# Patient Record
Sex: Female | Born: 1975 | Hispanic: No | Marital: Married | Smoking: Former smoker
Health system: Southern US, Community
[De-identification: ages and names within clinical notes are randomized; demographics above are authoritative.]

## PROBLEM LIST (undated history)

## (undated) DIAGNOSIS — Z789 Other specified health status: Secondary | ICD-10-CM

## (undated) HISTORY — PX: NO PAST SURGERIES: SHX2092

## (undated) HISTORY — PX: MOUTH SURGERY: SHX715

---

## 1999-01-08 ENCOUNTER — Emergency Department (HOSPITAL_COMMUNITY): Admission: EM | Admit: 1999-01-08 | Discharge: 1999-01-08 | Payer: Self-pay | Admitting: Emergency Medicine

## 1999-01-08 ENCOUNTER — Encounter: Payer: Self-pay | Admitting: Emergency Medicine

## 1999-04-10 ENCOUNTER — Other Ambulatory Visit: Admission: RE | Admit: 1999-04-10 | Discharge: 1999-04-10 | Payer: Self-pay | Admitting: Obstetrics and Gynecology

## 2000-04-11 ENCOUNTER — Other Ambulatory Visit: Admission: RE | Admit: 2000-04-11 | Discharge: 2000-04-11 | Payer: Self-pay | Admitting: *Deleted

## 2000-09-08 ENCOUNTER — Other Ambulatory Visit: Admission: RE | Admit: 2000-09-08 | Discharge: 2000-09-08 | Payer: Self-pay | Admitting: *Deleted

## 2001-04-01 ENCOUNTER — Inpatient Hospital Stay (HOSPITAL_COMMUNITY): Admission: AD | Admit: 2001-04-01 | Discharge: 2001-04-03 | Payer: Self-pay | Admitting: Obstetrics and Gynecology

## 2001-04-06 ENCOUNTER — Encounter: Admission: RE | Admit: 2001-04-06 | Discharge: 2001-05-06 | Payer: Self-pay | Admitting: Obstetrics and Gynecology

## 2001-12-11 ENCOUNTER — Other Ambulatory Visit: Admission: RE | Admit: 2001-12-11 | Discharge: 2001-12-11 | Payer: Self-pay | Admitting: *Deleted

## 2003-10-11 ENCOUNTER — Other Ambulatory Visit: Admission: RE | Admit: 2003-10-11 | Discharge: 2003-10-11 | Payer: Self-pay | Admitting: *Deleted

## 2003-10-18 ENCOUNTER — Emergency Department (HOSPITAL_COMMUNITY): Admission: AD | Admit: 2003-10-18 | Discharge: 2003-10-18 | Payer: Self-pay | Admitting: Family Medicine

## 2005-08-01 ENCOUNTER — Ambulatory Visit: Payer: Self-pay | Admitting: Orthopedic Surgery

## 2007-01-22 ENCOUNTER — Encounter: Admission: RE | Admit: 2007-01-22 | Discharge: 2007-01-22 | Payer: Self-pay | Admitting: Family Medicine

## 2010-11-18 ENCOUNTER — Encounter: Payer: Self-pay | Admitting: Obstetrics and Gynecology

## 2011-03-22 ENCOUNTER — Emergency Department (HOSPITAL_COMMUNITY): Payer: No Typology Code available for payment source

## 2011-03-22 ENCOUNTER — Emergency Department (HOSPITAL_COMMUNITY)
Admission: EM | Admit: 2011-03-22 | Discharge: 2011-03-23 | Disposition: A | Discharge status: Home / Self Care | Payer: No Typology Code available for payment source | Attending: Emergency Medicine | Admitting: Emergency Medicine

## 2011-03-22 DIAGNOSIS — S335XXA Sprain of ligaments of lumbar spine, initial encounter: Secondary | ICD-10-CM | POA: Insufficient documentation

## 2011-03-22 DIAGNOSIS — M25559 Pain in unspecified hip: Secondary | ICD-10-CM | POA: Insufficient documentation

## 2011-03-22 DIAGNOSIS — Y92410 Unspecified street and highway as the place of occurrence of the external cause: Secondary | ICD-10-CM | POA: Insufficient documentation
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
## 2018-06-19 LAB — OB RESULTS CONSOLE ABO/RH: RH Type: POSITIVE

## 2018-06-19 LAB — OB RESULTS CONSOLE RUBELLA ANTIBODY, IGM: Rubella: IMMUNE

## 2018-06-19 LAB — OB RESULTS CONSOLE ANTIBODY SCREEN: Antibody Screen: NEGATIVE

## 2018-06-19 LAB — OB RESULTS CONSOLE HIV ANTIBODY (ROUTINE TESTING): HIV: NONREACTIVE

## 2018-06-19 LAB — OB RESULTS CONSOLE GC/CHLAMYDIA
Chlamydia: NEGATIVE
Gonorrhea: NEGATIVE

## 2018-06-19 LAB — OB RESULTS CONSOLE RPR: RPR: NONREACTIVE

## 2018-06-19 LAB — OB RESULTS CONSOLE HEPATITIS B SURFACE ANTIGEN: Hepatitis B Surface Ag: NEGATIVE

## 2018-10-28 NOTE — L&D Delivery Note (Signed)
Delivery Note At 8:43 PM a viable female was delivered via Vaginal, Spontaneous (Presentation: DOA;  ).  APGAR: 6, 9; weight pending .   Placenta status: spontaneous, intact, .  Cord: 3VC  with the following complications: none.  Cord pH: n/a  Anesthesia:epidural   Episiotomy: None Lacerations: 1st degree;Perineal Suture Repair: 3.0 vicryl rapide 2 figure of 8's Est. Blood Loss (mL): 100  Mom to postpartum.  Baby to Couplet care / Skin to Skin.  Lendon Colonel 01/11/2019, 9:03 PM

## 2018-12-15 LAB — OB RESULTS CONSOLE GBS: GBS: NEGATIVE

## 2019-01-04 ENCOUNTER — Telehealth (HOSPITAL_COMMUNITY): Payer: Self-pay | Admitting: *Deleted

## 2019-01-04 ENCOUNTER — Encounter (HOSPITAL_COMMUNITY): Payer: Self-pay | Admitting: *Deleted

## 2019-01-05 NOTE — Telephone Encounter (Signed)
Preadmission screen  

## 2019-01-06 ENCOUNTER — Inpatient Hospital Stay (HOSPITAL_COMMUNITY)
Admission: AD | Admit: 2019-01-06 | Discharge: 2019-01-06 | Disposition: A | Discharge status: Home / Self Care | Payer: BLUE CROSS/BLUE SHIELD | Attending: Obstetrics and Gynecology | Admitting: Obstetrics and Gynecology

## 2019-01-06 ENCOUNTER — Encounter (HOSPITAL_COMMUNITY): Payer: Self-pay | Admitting: *Deleted

## 2019-01-06 ENCOUNTER — Other Ambulatory Visit: Payer: Self-pay

## 2019-01-06 DIAGNOSIS — Z3A38 38 weeks gestation of pregnancy: Secondary | ICD-10-CM

## 2019-01-06 DIAGNOSIS — Z0371 Encounter for suspected problem with amniotic cavity and membrane ruled out: Secondary | ICD-10-CM

## 2019-01-06 HISTORY — DX: Other specified health status: Z78.9

## 2019-01-06 LAB — AMNISURE RUPTURE OF MEMBRANE (ROM) NOT AT ARMC: Amnisure ROM: NEGATIVE

## 2019-01-06 NOTE — MAU Note (Signed)
PT SAYS SROM AT 0230-  STILL WET.   PNC  WITH DR Ernestina Penna-  VE IN OFFICE ON Monday- DENIES HSV AND MRSA. , GBS- NEG 5 West Progression Recent Vital Signs   There were no vitals taken for this visit.   No past medical history on file.   Expected Discharge Date     Diet Order    None       VTE Documentation      Work Intensity Score/Level of Care     @LEVELOFCARE @   Mobility        Significant Events    DC Barriers   Abnormal Labs:  Dionne Milo 01/06/2019, 6:58 AM CM.

## 2019-01-06 NOTE — Discharge Instructions (Signed)
Braxton Hicks Contractions °Contractions of the uterus can occur throughout pregnancy, but they are not always a sign that you are in labor. You may have practice contractions called Braxton Hicks contractions. These false labor contractions are sometimes confused with true labor. °What are Braxton Hicks contractions? °Braxton Hicks contractions are tightening movements that occur in the muscles of the uterus before labor. Unlike true labor contractions, these contractions do not result in opening (dilation) and thinning of the cervix. Toward the end of pregnancy (32-34 weeks), Braxton Hicks contractions can happen more often and may become stronger. These contractions are sometimes difficult to tell apart from true labor because they can be very uncomfortable. You should not feel embarrassed if you go to the hospital with false labor. °Sometimes, the only way to tell if you are in true labor is for your health care provider to look for changes in the cervix. The health care provider will do a physical exam and may monitor your contractions. If you are not in true labor, the exam should show that your cervix is not dilating and your water has not broken. °If there are no other health problems associated with your pregnancy, it is completely safe for you to be sent home with false labor. You may continue to have Braxton Hicks contractions until you go into true labor. °How to tell the difference between true labor and false labor °True labor °· Contractions last 30-70 seconds. °· Contractions become very regular. °· Discomfort is usually felt in the top of the uterus, and it spreads to the lower abdomen and low back. °· Contractions do not go away with walking. °· Contractions usually become more intense and increase in frequency. °· The cervix dilates and gets thinner. °False labor °· Contractions are usually shorter and not as strong as true labor contractions. °· Contractions are usually irregular. °· Contractions  are often felt in the front of the lower abdomen and in the groin. °· Contractions may go away when you walk around or change positions while lying down. °· Contractions get weaker and are shorter-lasting as time goes on. °· The cervix usually does not dilate or become thin. °Follow these instructions at home: ° °· Take over-the-counter and prescription medicines only as told by your health care provider. °· Keep up with your usual exercises and follow other instructions from your health care provider. °· Eat and drink lightly if you think you are going into labor. °· If Braxton Hicks contractions are making you uncomfortable: °? Change your position from lying down or resting to walking, or change from walking to resting. °? Sit and rest in a tub of warm water. °? Drink enough fluid to keep your urine pale yellow. Dehydration may cause these contractions. °? Do slow and deep breathing several times an hour. °· Keep all follow-up prenatal visits as told by your health care provider. This is important. °Contact a health care provider if: °· You have a fever. °· You have continuous pain in your abdomen. °Get help right away if: °· Your contractions become stronger, more regular, and closer together. °· You have fluid leaking or gushing from your vagina. °· You have bleeding from your vagina. °· You have low back pain that you never had before. °· You feel your baby’s head pushing down and causing pelvic pressure. °· Your baby is not moving inside you as much as it used to. °Summary °· Contractions that occur before labor are called Braxton Hicks contractions, false labor, or   practice contractions. °· Braxton Hicks contractions are usually shorter, weaker, farther apart, and less regular than true labor contractions. True labor contractions usually become progressively stronger and regular, and they become more frequent. °· Manage discomfort from Braxton Hicks contractions by changing position, resting in a warm bath,  drinking plenty of water, or practicing deep breathing. °This information is not intended to replace advice given to you by your health care provider. Make sure you discuss any questions you have with your health care provider. °Document Released: 02/27/2017 Document Revised: 07/29/2017 Document Reviewed: 02/27/2017 °Elsevier Interactive Patient Education © 2019 [employer] ° °

## 2019-01-06 NOTE — MAU Provider Note (Signed)
S: Martha Hester is a 43 y.o. G3P0010 at [redacted]w[redacted]d  who presents to MAU today complaining of leaking of fluid since 0230. She denies vaginal bleeding. She endorses contractions. She reports normal fetal movement.    O: BP 109/77 (BP Location: Right Arm)   Pulse 74   Temp 98.1 F (36.7 C) (Oral)   Resp 20   Ht 5\' 4"  (1.626 m)   Wt 75.3 kg   BMI 28.50 kg/m  GENERAL: Well-developed, well-nourished female in no acute distress.  HEAD: Normocephalic, atraumatic.  CHEST: Normal effort of breathing, regular heart rate ABDOMEN: Soft, nontender, gravid   Cervical exam:  Dilation: 2 Effacement (%): 30 Cervical Position: Middle Station: -3 Presentation: Vertex Exam by:: Dorrene German RN   Fetal Monitoring: Baseline: 125 Variability: moderate Accelerations: present Decelerations: variable Contractions: regular every 1-4 mins  Results for orders placed or performed during the hospital encounter of 01/06/19 (from the past 24 hour(s))  Amnisure rupture of membrane (rom)not at [facility]     Status: None   Collection Time: 01/06/19  7:32 AM  Result Value Ref Range   Amnisure ROM NEGATIVE     A: SIUP at [redacted]w[redacted]d  Membranes intact  P: No leakage of amniotic fluid into vagina - Reassurance given that membranes are not ruptured - Information provided on labor precautions, [facility] - Keep scheduled appt for IOL on 01/11/2019 - Patient verbalized an understanding of the plan of care and agrees.    Raelyn Mora, CNM 01/06/2019, 8:42 AM

## 2019-01-08 ENCOUNTER — Encounter (HOSPITAL_COMMUNITY): Payer: Self-pay | Admitting: *Deleted

## 2019-01-08 ENCOUNTER — Telehealth (HOSPITAL_COMMUNITY): Payer: Self-pay | Admitting: *Deleted

## 2019-01-08 ENCOUNTER — Other Ambulatory Visit (HOSPITAL_COMMUNITY): Payer: Self-pay | Admitting: *Deleted

## 2019-01-08 ENCOUNTER — Other Ambulatory Visit: Payer: Self-pay | Admitting: Obstetrics

## 2019-01-08 NOTE — Telephone Encounter (Signed)
Preadmission screen  

## 2019-01-10 ENCOUNTER — Other Ambulatory Visit: Payer: Self-pay | Admitting: Obstetrics

## 2019-01-11 ENCOUNTER — Inpatient Hospital Stay (HOSPITAL_COMMUNITY)
Admission: AD | Admit: 2019-01-11 | Discharge: 2019-01-13 | DRG: 807 | Disposition: A | Discharge status: Home / Self Care | Payer: 59 | Source: Ambulatory Visit | Attending: Obstetrics | Admitting: Obstetrics

## 2019-01-11 ENCOUNTER — Other Ambulatory Visit: Payer: Self-pay

## 2019-01-11 ENCOUNTER — Inpatient Hospital Stay (HOSPITAL_COMMUNITY): Payer: 59

## 2019-01-11 ENCOUNTER — Inpatient Hospital Stay (HOSPITAL_COMMUNITY): Payer: 59 | Admitting: Anesthesiology

## 2019-01-11 ENCOUNTER — Encounter (HOSPITAL_COMMUNITY): Payer: Self-pay

## 2019-01-11 DIAGNOSIS — Z87891 Personal history of nicotine dependence: Secondary | ICD-10-CM

## 2019-01-11 DIAGNOSIS — O9902 Anemia complicating childbirth: Secondary | ICD-10-CM | POA: Diagnosis present

## 2019-01-11 DIAGNOSIS — O26893 Other specified pregnancy related conditions, third trimester: Principal | ICD-10-CM | POA: Diagnosis present

## 2019-01-11 DIAGNOSIS — D509 Iron deficiency anemia, unspecified: Secondary | ICD-10-CM | POA: Diagnosis present

## 2019-01-11 DIAGNOSIS — Z3A39 39 weeks gestation of pregnancy: Secondary | ICD-10-CM

## 2019-01-11 DIAGNOSIS — Z0371 Encounter for suspected problem with amniotic cavity and membrane ruled out: Secondary | ICD-10-CM

## 2019-01-11 DIAGNOSIS — Z3490 Encounter for supervision of normal pregnancy, unspecified, unspecified trimester: Secondary | ICD-10-CM

## 2019-01-11 LAB — POCT FERN TEST: POCT Fern Test: POSITIVE

## 2019-01-11 LAB — CBC
HCT: 33.6 % — ABNORMAL LOW (ref 36.0–46.0)
Hemoglobin: 10.1 g/dL — ABNORMAL LOW (ref 12.0–15.0)
MCH: 24.9 pg — ABNORMAL LOW (ref 26.0–34.0)
MCHC: 30.1 g/dL (ref 30.0–36.0)
MCV: 82.8 fL (ref 80.0–100.0)
PLATELETS: 342 10*3/uL (ref 150–400)
RBC: 4.06 MIL/uL (ref 3.87–5.11)
RDW: 14.9 % (ref 11.5–15.5)
WBC: 11.2 10*3/uL — ABNORMAL HIGH (ref 4.0–10.5)
nRBC: 0.2 % (ref 0.0–0.2)

## 2019-01-11 LAB — TYPE AND SCREEN
ABO/RH(D): A POS
Antibody Screen: NEGATIVE

## 2019-01-11 LAB — RPR: RPR Ser Ql: NONREACTIVE

## 2019-01-11 MED ORDER — SENNOSIDES-DOCUSATE SODIUM 8.6-50 MG PO TABS
2.0000 | ORAL_TABLET | ORAL | Status: DC
  Filled 2019-01-11 (×2): qty 2

## 2019-01-11 MED ORDER — ACETAMINOPHEN 325 MG PO TABS: 650.0000 mg | ORAL_TABLET | ORAL | Status: DC | PRN

## 2019-01-11 MED ORDER — TERBUTALINE SULFATE 1 MG/ML IJ SOLN: 0.2500 mg | Freq: Once | INTRAMUSCULAR | Status: DC | PRN

## 2019-01-11 MED ORDER — SOD CITRATE-CITRIC ACID 500-334 MG/5ML PO SOLN: 30.0000 mL | ORAL | Status: DC | PRN

## 2019-01-11 MED ORDER — OXYCODONE HCL 5 MG PO TABS: 5.0000 mg | ORAL_TABLET | ORAL | Status: DC | PRN

## 2019-01-11 MED ORDER — FENTANYL CITRATE (PF) 100 MCG/2ML IJ SOLN: 100.0000 ug | INTRAMUSCULAR | Status: DC | PRN

## 2019-01-11 MED ORDER — WITCH HAZEL-GLYCERIN EX PADS
1.0000 "application " | MEDICATED_PAD | CUTANEOUS | Status: DC | PRN
  Administered 2019-01-12: 1 via TOPICAL

## 2019-01-11 MED ORDER — LIDOCAINE HCL (PF) 1 % IJ SOLN
INTRAMUSCULAR | Status: DC | PRN
  Administered 2019-01-11: 6 mL via EPIDURAL

## 2019-01-11 MED ORDER — LACTATED RINGERS IV SOLN
500.0000 mL | Freq: Once | INTRAVENOUS | Status: AC
  Administered 2019-01-11: 500 mL via INTRAVENOUS

## 2019-01-11 MED ORDER — OXYCODONE HCL 5 MG PO TABS: 10.0000 mg | ORAL_TABLET | ORAL | Status: DC | PRN

## 2019-01-11 MED ORDER — BENZOCAINE-MENTHOL 20-0.5 % EX AERO
1.0000 "application " | INHALATION_SPRAY | CUTANEOUS | Status: DC | PRN
  Administered 2019-01-12: 1 via TOPICAL
  Filled 2019-01-11: qty 56

## 2019-01-11 MED ORDER — ZOLPIDEM TARTRATE 5 MG PO TABS: 5.0000 mg | ORAL_TABLET | Freq: Every evening | ORAL | Status: DC | PRN

## 2019-01-11 MED ORDER — OXYTOCIN 40 UNITS IN NORMAL SALINE INFUSION - SIMPLE MED: 2.5000 [IU]/h | INTRAVENOUS | Status: DC

## 2019-01-11 MED ORDER — DIPHENHYDRAMINE HCL 50 MG/ML IJ SOLN: 12.5000 mg | INTRAMUSCULAR | Status: DC | PRN

## 2019-01-11 MED ORDER — DIBUCAINE 1 % RE OINT: 1.0000 "application " | TOPICAL_OINTMENT | RECTAL | Status: DC | PRN

## 2019-01-11 MED ORDER — OXYTOCIN BOLUS FROM INFUSION
500.0000 mL | Freq: Once | INTRAVENOUS | Status: AC
  Administered 2019-01-11: 500 mL via INTRAVENOUS

## 2019-01-11 MED ORDER — IBUPROFEN 600 MG PO TABS
600.0000 mg | ORAL_TABLET | Freq: Four times a day (QID) | ORAL | Status: DC
  Filled 2019-01-11: qty 1

## 2019-01-11 MED ORDER — LACTATED RINGERS IV SOLN
INTRAVENOUS | Status: DC
  Administered 2019-01-11 (×3): via INTRAVENOUS

## 2019-01-11 MED ORDER — PHENYLEPHRINE 40 MCG/ML (10ML) SYRINGE FOR IV PUSH (FOR BLOOD PRESSURE SUPPORT)
80.0000 ug | PREFILLED_SYRINGE | INTRAVENOUS | Status: DC | PRN
  Filled 2019-01-11: qty 10

## 2019-01-11 MED ORDER — COCONUT OIL OIL: 1.0000 "application " | TOPICAL_OIL | Status: DC | PRN

## 2019-01-11 MED ORDER — SODIUM CHLORIDE (PF) 0.9 % IJ SOLN
INTRAMUSCULAR | Status: DC | PRN
  Administered 2019-01-11: 12 mL/h via EPIDURAL

## 2019-01-11 MED ORDER — EPHEDRINE 5 MG/ML INJ: 10.0000 mg | INTRAVENOUS | Status: DC | PRN

## 2019-01-11 MED ORDER — PRENATAL MULTIVITAMIN CH
1.0000 | ORAL_TABLET | Freq: Every day | ORAL | Status: DC
  Filled 2019-01-11: qty 1

## 2019-01-11 MED ORDER — DIPHENHYDRAMINE HCL 25 MG PO CAPS: 25.0000 mg | ORAL_CAPSULE | Freq: Four times a day (QID) | ORAL | Status: DC | PRN

## 2019-01-11 MED ORDER — ONDANSETRON HCL 4 MG PO TABS: 4.0000 mg | ORAL_TABLET | ORAL | Status: DC | PRN

## 2019-01-11 MED ORDER — FENTANYL-BUPIVACAINE-NACL 0.5-0.125-0.9 MG/250ML-% EP SOLN: 12.0000 mL/h | EPIDURAL | Status: DC | PRN

## 2019-01-11 MED ORDER — PHENYLEPHRINE 40 MCG/ML (10ML) SYRINGE FOR IV PUSH (FOR BLOOD PRESSURE SUPPORT): 80.0000 ug | PREFILLED_SYRINGE | INTRAVENOUS | Status: DC | PRN

## 2019-01-11 MED ORDER — LIDOCAINE HCL (PF) 1 % IJ SOLN: 30.0000 mL | INTRAMUSCULAR | Status: DC | PRN

## 2019-01-11 MED ORDER — OXYTOCIN 40 UNITS IN NORMAL SALINE INFUSION - SIMPLE MED
1.0000 m[IU]/min | INTRAVENOUS | Status: DC
  Administered 2019-01-11: 2 m[IU]/min via INTRAVENOUS
  Filled 2019-01-11: qty 1000

## 2019-01-11 MED ORDER — FENTANYL-BUPIVACAINE-NACL 0.5-0.125-0.9 MG/250ML-% EP SOLN
12.0000 mL/h | EPIDURAL | Status: DC | PRN
  Filled 2019-01-11: qty 250

## 2019-01-11 MED ORDER — TETANUS-DIPHTH-ACELL PERTUSSIS 5-2.5-18.5 LF-MCG/0.5 IM SUSP: 0.5000 mL | Freq: Once | INTRAMUSCULAR | Status: DC

## 2019-01-11 MED ORDER — ONDANSETRON HCL 4 MG/2ML IJ SOLN: 4.0000 mg | INTRAMUSCULAR | Status: DC | PRN

## 2019-01-11 MED ORDER — LACTATED RINGERS IV SOLN: 500.0000 mL | INTRAVENOUS | Status: DC | PRN

## 2019-01-11 MED ORDER — SIMETHICONE 80 MG PO CHEW: 80.0000 mg | CHEWABLE_TABLET | ORAL | Status: DC | PRN

## 2019-01-11 MED ORDER — ONDANSETRON HCL 4 MG/2ML IJ SOLN: 4.0000 mg | Freq: Four times a day (QID) | INTRAMUSCULAR | Status: DC | PRN

## 2019-01-11 NOTE — Anesthesia Pain Management Evaluation Note (Signed)
  CRNA Pain Management Visit Note  Patient: Martha Hester, 43 y.o., female  "Hello I am a member of the anesthesia team at [facility]. We have an anesthesia team available at all times to provide care throughout the hospital, including epidural management and anesthesia for C-section. I don't know your plan for the delivery whether it a natural birth, water birth, IV sedation, nitrous supplementation, doula or epidural, but we want to meet your pain goals."   1.Was your pain managed to your expectations on prior hospitalizations?   Yes   2.What is your expectation for pain management during this hospitalization?     Epidural  3.How can we help you reach that goal?   Record the patient's initial score and the patient's pain goal.   Pain: 4  Pain Goal: 9 [facility] wants you to be able to say your pain was always managed very well.  Laban Emperor 01/11/2019

## 2019-01-11 NOTE — Progress Notes (Addendum)
S: Doing well, no complaints, pain well controlled with epidural, still feeling ctx though epidural just placed. Feels SOB.   O: BP 117/74   Pulse 85   Temp 98.2 F (36.8 C) (Oral)   Resp 20   Ht 5\' 4"  (1.626 m)   Wt 76.2 kg   BMI 28.84 kg/m    FHT:  FHR: 120s bpm, variability: moderate,  accelerations:  Present,  decelerations:  Absent UC:   regular, every 2-4 minutes, pit at 4 munits/ min SVE:   Dilation: 5 Effacement (%): 90 Station: -1 Exam by:: Dr Ernestina Penna AROM forebag  A / P:  42 y.o.  OB History  Gravida Para Term Preterm AB Living  3 1 0 0 1 1  SAB TAB Ectopic Multiple Live Births  1 0 0 0 1   at [redacted]w[redacted]d IOL at term, AMA, adequate progress  Fetal Wellbeing:  Category I Pain Control:  Epidural  Anticipated MOD:  NSVD  Lendon Colonel 01/11/2019, 2:35 PM  PE: Anxious Normal respirations Abd: gravid, NT LE: trace edema  Lendon Colonel 01/11/2019 2:43 PM

## 2019-01-11 NOTE — Anesthesia Procedure Notes (Signed)
Epidural Patient location during procedure: OB Start time: 01/11/2019 1:54 PM End time: 01/11/2019 1:57 PM  Staffing Anesthesiologist: Bethena Midget, MD  Preanesthetic Checklist Completed: patient identified, site marked, surgical consent, pre-op evaluation, timeout performed, IV checked, risks and benefits discussed and monitors and equipment checked  Epidural Patient position: sitting Prep: site prepped and draped and DuraPrep Patient monitoring: continuous pulse ox and blood pressure Approach: midline Location: L3-L4 Injection technique: LOR air  Needle:  Needle type: Tuohy  Needle gauge: 17 G Needle length: 9 cm and 9 Needle insertion depth: 4 cm Catheter type: closed end flexible Catheter size: 19 Gauge Catheter at skin depth: 9 cm Test dose: negative  Assessment Events: blood not aspirated, injection not painful, no injection resistance, negative IV test and no paresthesia

## 2019-01-11 NOTE — Progress Notes (Signed)
Small break from pushing.  MD delivering infant in another room

## 2019-01-11 NOTE — Anesthesia Preprocedure Evaluation (Signed)
Anesthesia Evaluation  Patient identified by MRN, date of birth, ID band Patient awake    Reviewed: Allergy & Precautions, H&P , NPO status , Patient's Chart, lab work & pertinent test results, reviewed documented beta blocker date and time   Airway Mallampati: II  TM Distance: >3 FB Neck ROM: full    Dental no notable dental hx.    Pulmonary neg pulmonary ROS, former smoker,    Pulmonary exam normal breath sounds clear to auscultation       Cardiovascular negative cardio ROS Normal cardiovascular exam Rhythm:regular Rate:Normal     Neuro/Psych negative neurological ROS  negative psych ROS   GI/Hepatic negative GI ROS, Neg liver ROS,   Endo/Other  negative endocrine ROS  Renal/GU negative Renal ROS  negative genitourinary   Musculoskeletal   Abdominal   Peds  Hematology negative hematology ROS (+)   Anesthesia Other Findings   Reproductive/Obstetrics (+) Pregnancy                             Anesthesia Physical Anesthesia Plan  ASA: II  Anesthesia Plan: Epidural   Post-op Pain Management:    Induction:   PONV Risk Score and Plan: 2 and Treatment may vary due to age or medical condition  Airway Management Planned:   Additional Equipment:   Intra-op Plan:   Post-operative Plan:   Informed Consent: I have reviewed the patients History and Physical, chart, labs and discussed the procedure including the risks, benefits and alternatives for the proposed anesthesia with the patient or authorized representative who has indicated his/her understanding and acceptance.     Dental Advisory Given  Plan Discussed with: CRNA, Anesthesiologist and Surgeon  Anesthesia Plan Comments: (Labs checked- platelets confirmed with RN in room. Fetal heart tracing, per RN, reported to be stable enough for sitting procedure. Discussed epidural, and patient consents to the procedure:  included risk  of possible headache,backache, failed block, allergic reaction, and nerve injury. This patient was asked if she had any questions or concerns before the procedure started.)        Anesthesia Quick Evaluation

## 2019-01-11 NOTE — MAU Note (Signed)
Pt here with c/o contractions that started yesterday, much more regular since 0200. Having some bleeding and leaking as well. Reports good fetal movement.

## 2019-01-11 NOTE — Progress Notes (Signed)
S: breathing with ctx O" BP 104/71   Pulse 78   Temp 97.7 F (36.5 C) (Oral)   Resp 17   Ht 5\' 4"  (1.626 m)   Wt 76.2 kg   BMI 28.84 kg/m  VE 3/80/-2  Tracing cat 1 baseline 130 (+) accels Ctx irreg  IMP: latent phase Term P0 start pitocin. Epidural prn

## 2019-01-11 NOTE — H&P (Signed)
Jasmynn Emmick is a 43 y.o. G3P0011 at [redacted]w[redacted]d presenting for IOL due to [facility]. Pt notes rare contractions . Good fetal movement, No vaginal bleeding, not leaking fluid.  PNCare at [facility] since 6 wks - dated by LMP c/w 6 wk u/s - AMA normal testing   Prenatal Transfer Tool  Maternal Diabetes: No Genetic Screening: Normal Maternal Ultrasounds/Referrals: Normal Fetal Ultrasounds or other Referrals:  None Maternal Substance Abuse:  No Significant Maternal Medications:  None Significant Maternal Lab Results: None     OB History    Gravida  3   Para  1   Term      Preterm      AB  1   Living  1     SAB  1   TAB      Ectopic      Multiple      Live Births  1          Past Medical History:  Diagnosis Date  . Medical history non-contributory    Past Surgical History:  Procedure Laterality Date  . MOUTH SURGERY    . NO PAST SURGERIES     Family History: family history includes Diabetes in her father and paternal grandmother; Heart attack in her father; Prostate cancer in her maternal grandfather. Social History:  reports that she has quit smoking. She has never used smokeless tobacco. She reports previous alcohol use. She reports that she does not use drugs.  Review of Systems - Negative except discomfort of preg   Dilation: 2.5 Effacement (%): 60 Station: -2 Exam by:: Pharmacologist Blood pressure 101/69, pulse 89, temperature 98.4 F (36.9 C), temperature source Oral, resp. rate 16, height 5\' 4"  (1.626 m), weight 76.2 kg.  Physical Exam: will update when seeing pt  Prenatal labs: ABO, Rh: --/--/PENDING (03/16 0557) A+ Antibody: PENDING (03/16 0557) neg Rubella: Immune (08/23 0000) RPR: Nonreactive (08/23 0000)  HBsAg: Negative (08/23 0000)  HIV: Non-reactive (08/23 0000)  GBS: Negative (02/18 0000)  1 hr Glucola 80  Genetic screening nl NT, nl AFp Anatomy US normal   Assessment/Plan: 43 y.o. G3P0011 at [redacted]w[redacted]d IOL, plan pitocin  2x2 AMA, nl testing, AGA on u/s GBS neg   Lendon Colonel 01/11/2019, 7:59 AM

## 2019-01-12 LAB — ABO/RH: ABO/RH(D): A POS

## 2019-01-12 MED ORDER — IBUPROFEN 200 MG PO TABS
200.0000 mg | ORAL_TABLET | Freq: Four times a day (QID) | ORAL | Status: DC
  Administered 2019-01-12 (×3): 200 mg via ORAL
  Filled 2019-01-12 (×7): qty 1

## 2019-01-12 MED ORDER — POLYSACCHARIDE IRON COMPLEX 150 MG PO CAPS
150.0000 mg | ORAL_CAPSULE | Freq: Every day | ORAL | Status: DC
  Filled 2019-01-12: qty 1

## 2019-01-12 MED ORDER — MAGNESIUM OXIDE 400 (241.3 MG) MG PO TABS
400.0000 mg | ORAL_TABLET | Freq: Every day | ORAL | Status: DC
  Filled 2019-01-12: qty 1
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
## 2019-01-12 NOTE — Anesthesia Postprocedure Evaluation (Signed)
Anesthesia Post Note  Patient: Clinical research associate  Procedure(s) Performed: AN AD HOC LABOR EPIDURAL     Patient location during evaluation: Mother Baby Anesthesia Type: Epidural Level of consciousness: awake and alert Pain management: pain level controlled Vital Signs Assessment: post-procedure vital signs reviewed and stable Respiratory status: spontaneous breathing, nonlabored ventilation and respiratory function stable Cardiovascular status: stable Postop Assessment: no headache, no backache, epidural receding, no apparent nausea or vomiting, patient able to bend at knees, able to ambulate and adequate PO intake Anesthetic complications: no    Last Vitals:  Vitals:   01/11/19 2350 01/12/19 0343  BP: 108/70 96/66  Pulse: 84 74  Resp: 18 18  Temp: 36.8 C 36.8 C    Last Pain:  Vitals:   01/12/19 0343  TempSrc: Oral  PainSc: 0-No pain   Pain Goal: Patients Stated Pain Goal: 2 (01/11/19 0619)                 Laban Emperor

## 2019-01-12 NOTE — Lactation Note (Signed)
This note was copied from a baby's chart. Lactation Consultation Note  Patient Name: Martha Hester [id number] Date: 01/12/2019 Reason for consult: Initial assessment;Term Baby is 64 hours old.  This is mom's second baby and she breastfed her first 17 years ago.  She states the nurses have assisted her with football hold and the baby is latching well.  Instructed to feed with any feeding cue and call for assist prn.  Breastfeeding consultation services and support information given and reviewed.  Maternal Data    Feeding Feeding Type: Breast Fed  LATCH Score                   Interventions    Lactation Tools Discussed/Used     Consult Status Consult Status: Follow-up Date: 01/13/19 Follow-up type: In-patient    Huston Foley 01/12/2019, 2:31 PM

## 2019-01-12 NOTE — Progress Notes (Signed)
PPD #1, SVD, 1st degree repair, baby boy "Stone"  S:  Reports feeling good, but very sore; having muscle soreness in legs and neck due to 3hrs of pushing. Feeling more tired on exertion and some SOB when she is up for too long              Tolerating po/ No nausea or vomiting / Denies CP, dizziness             Bleeding is light             Pain controlled with Motrin             Up ad lib / ambulatory / voiding QS  Newborn breast feeding - going well / Circumcision - planning today   O:               VS: BP (!) 85/59   Pulse 70   Temp 97.9 F (36.6 C) (Oral)   Resp 18   Ht 5\' 4"  (1.626 m)   Wt 76.2 kg   Breastfeeding Unknown   BMI 28.84 kg/m    LABS:              Recent Labs    01/11/19 0557  WBC 11.2*  HGB 10.1*  PLT 342               Blood type: --/--/A POS, A POS Performed at [facility], [address] [place], [place] [address]  [phone])  Rubella: Immune (08/23 0000)                     I&O: Intake/Output      03/16 0701 - 03/17 0700 03/17 0701 - 03/18 0700   I.V. (mL/kg) 1197.1 (15.7)    Total Intake(mL/kg) 1197.1 (15.7)    Urine (mL/kg/hr) 150 (0.1)    Blood 100    Total Output 250    Net +947.1         Urine Occurrence 1 x                  Physical Exam:             Alert and oriented X3  Lungs: Clear and unlabored  Heart: regular rate and rhythm / no murmurs  Abdomen: soft, non-tender, non-distended              Fundus: firm, non-tender, U-1  Perineum: well approximated first degree repair, (+) edema, ice pack in place; no ecchymosis, mild erythema   Lochia: small, no clots   Extremities: no edema, no calf pain or tenderness    A: PPD # 1, SVD  1st degree repair  Hx. Of Anxiety  - s/p CSW consult  Maternal IDA  Doing well - stable status  P: Routine post partum orders  Niferex 150mg  PO daily  Magnesium oxide 400mg  PO daily   See lactation today  D/C IV; may shower today  Anticipate d/c home tomorrow   Carlean Jews, MSN,  CNM [facility]

## 2019-01-13 NOTE — Lactation Note (Signed)
This note was copied from a baby's chart. Lactation Consultation Note  Patient Name: Martha Hester Date: 01/13/2019 Reason for consult: Follow-up assessment;Infant weight loss;Term;Other (Comment);Nipple pain/trauma(7% weight loss / sore nipples / see LC note / its been 17 years since last breast fed )  Baby is 37 hours old  As LC entered the room baby latched shallow without pillow support and mom mentioned she was having discomfort  At the nipple. LC had mom release suction / added adequate support/ and assisted her to obtain the depth with breast  Compressions/ increased swallows noted with compressions.  LC reviewed basics of latching / prevention of sore nipples and engorgement.  Mom already has comfort gels, and shells. LC instructed mom on the use of hand pump .  Per mom has a DEBP Medela at home.  LC discussed nutritive vs non - nutritive feeding patterns and the importance of watching the baby for hanging out  Latched. Importance of STS feedings until the baby can stay awake for majority of feeding/ back to birth weight / gaining steadily. Breast feeding goal is 8-12 times in 24 hours.  Mother informed of post-discharge support and given phone number to the lactation department, including services for phone call assistance; out-patient appointments; and breastfeeding support group. List of other breastfeeding resources in the community given in the handout. Encouraged mother to call for problems or concerns related to breastfeeding.   Maternal Data Has patient been taught Hand Expression?: Yes(LC reviewed ) Does the patient have breastfeeding experience prior to this delivery?: Yes  Feeding Feeding Type: Breast Fed  LATCH Score Latch: Grasps breast easily, tongue down, lips flanged, rhythmical sucking.  Audible Swallowing: Spontaneous and intermittent  Type of Nipple: Everted at rest and after stimulation  Comfort (Breast/Nipple): Filling, red/small blisters or  bruises, mild/mod discomfort  Hold (Positioning): Assistance needed to correctly position infant at breast and maintain latch.  LATCH Score: 8  Interventions Interventions: Breast feeding basics reviewed;Assisted with latch;Skin to skin;Breast massage;Hand express;Breast compression;Adjust position;Support pillows;Position options;Shells;Comfort gels;Hand pump  Lactation Tools Discussed/Used Tools: Shells;Pump;Comfort gels Shell Type: Inverted Breast pump type: Manual WIC Program: No Pump Review: Setup, frequency, and cleaning;Milk Storage Initiated by:: MAI  Date initiated:: 01/13/19   Consult Status Consult Status: Complete Date: 01/13/19 Follow-up type: In-patient    Martha Hester 01/13/2019, 10:12 AM

## 2019-01-13 NOTE — Lactation Note (Signed)
This note was copied from a baby's chart. Lactation Consultation Note Mom hopes for d/c home today. Mom states baby is cluster feeding. Nipples sore, red, short shaft. Mom states she doesn't feel baby is getting deep enough latch. Asked mom to call for assistance and observation of latch. Request LC to come back before mom is d/c home. Comfort gels given to mom. Shells given to assist in everting nipples more.  Mom BF her now 41 yr old for over 1 yr.  Patient Name: Boy Earldean Lobeck PIRJJ'O Date: 01/13/2019 Reason for consult: Follow-up assessment   Maternal Data Has patient been taught Hand Expression?: Yes Does the patient have breastfeeding experience prior to this delivery?: Yes  Feeding Feeding Type: Breast Fed  LATCH Score          Comfort (Breast/Nipple): Filling, red/small blisters or bruises, mild/mod discomfort(nipples sore)  Hold (Positioning): No assistance needed to correctly position infant at breast.     Interventions Interventions: Breast feeding basics reviewed;Shells;Comfort gels  Lactation Tools Discussed/Used Tools: Shells Shell Type: Inverted WIC Program: No   Consult Status Consult Status: Follow-up Date: 01/13/19(wants wants latch to be seen before d/c home) Follow-up type: In-patient    Naome Brigandi, Diamond Nickel 01/13/2019, 7:07 AM

## 2019-01-13 NOTE — Discharge Summary (Signed)
Obstetric Discharge Summary Reason for Admission: induction of labor for AMA Prenatal Procedures: NST and ultrasound Intrapartum Procedures: spontaneous vaginal delivery and epidural Postpartum Procedures: none Complications-Operative and Postpartum: 1st degree perineal laceration Hemoglobin  Date Value Ref Range Status  01/11/2019 10.1 (L) 12.0 - 15.0 g/dL Final   HCT  Date Value Ref Range Status  01/11/2019 33.6 (L) 36.0 - 46.0 % Final    Physical Exam:  General: alert, cooperative and no distress Lochia: appropriate Uterine Fundus: firm Incision: healing well DVT Evaluation: No evidence of DVT seen on physical exam.  Discharge Diagnoses: Term Pregnancy-delivered  Discharge Information: Date: 01/13/2019 Activity: pelvic rest Diet: routine Medications: PNV Condition: stable Instructions: refer to practice specific booklet Discharge to: home Follow-up Information    Noland Fordyce, MD. Schedule an appointment as soon as possible for a visit in 6 week(s).   Specialty:  Obstetrics and Gynecology Contact information: [address] [place] [address] [phone]           Newborn Data: Live born female  Birth Weight: 7 lb 3.3 oz (3270 g) APGAR: 6, 9  Newborn Delivery   Birth date/time:  01/11/2019 20:43:00 Delivery type:  Vaginal, Spontaneous     Home with mother.  Marlinda Mike 01/13/2019, 4:31 PM

## 2019-01-13 NOTE — Progress Notes (Signed)
PPD 2 SVD with 1st degree repair  S:  Reports feeling well             Tolerating po/ No nausea or vomiting             Bleeding is light             Pain controlled with Motrin OTC dose             Up ad lib / ambulatory / voiding QS  Newborn Breast   O:    VS: BP 106/72   Pulse 83   Temp 98.1 F (36.7 C) (Oral)   Resp 17   Ht 5\' 4"  (1.626 m)   Wt 76.2 kg   SpO2 100%   Breastfeeding Unknown   BMI 28.84 kg/m    Physical Exam:             Alert and oriented X3             Fundus: firm, non-tender, U-1  Lochia: light  Extremities: no edema, no calf pain or tenderness  A: PPD # 2   Doing well - stable status  P: Routine post partum orders  DC home  Marlinda Mike CNM, MSN, [facility] 01/13/2019, 3:13 PM
